# Patient Record
Sex: Male | Born: 1960 | Race: Black or African American | Hispanic: No | Marital: Married | Smoking: Never smoker
Health system: Southern US, Community
[De-identification: ages and names within clinical notes are randomized; demographics above are authoritative.]

## PROBLEM LIST (undated history)

## (undated) DIAGNOSIS — I10 Essential (primary) hypertension: Secondary | ICD-10-CM

---

## 2005-07-14 ENCOUNTER — Emergency Department: Payer: Self-pay | Admitting: Emergency Medicine

## 2009-08-22 ENCOUNTER — Emergency Department: Payer: Self-pay

## 2010-04-05 ENCOUNTER — Ambulatory Visit: Payer: Self-pay | Admitting: Ophthalmology

## 2011-04-13 IMAGING — CR DG CHEST 2V
1 series · 3 of 3 positions shown · non-contrast
Comparison: none

REASON FOR EXAM: [DATE] Uveitis NOS
COMMENTS:

PROCEDURE:     DXR - DXR CHEST PA (OR AP) AND LATERAL  - April 05, 2010 [DATE]
RESULT:     Comparison: None

[Series 1: view not recorded · 0.17mm/px · 3 of 3 slices shown]
[im 1/3]
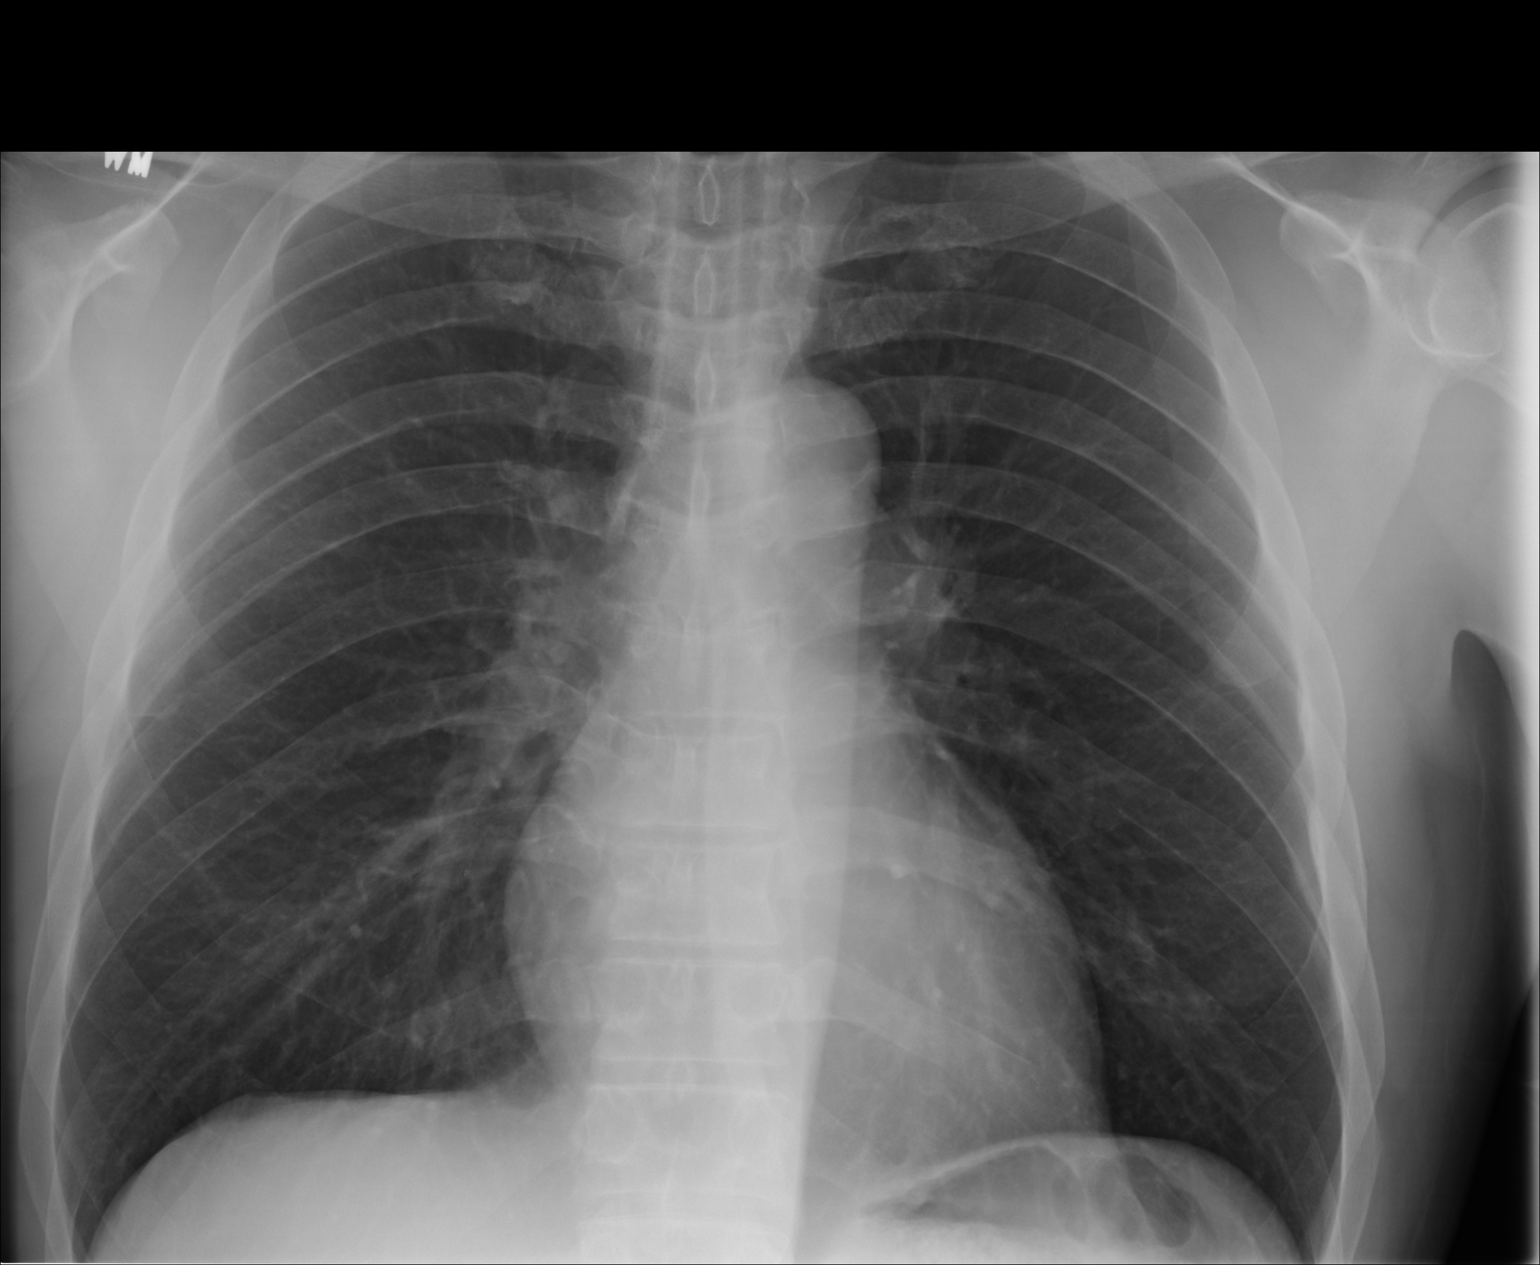
[im 2/3]
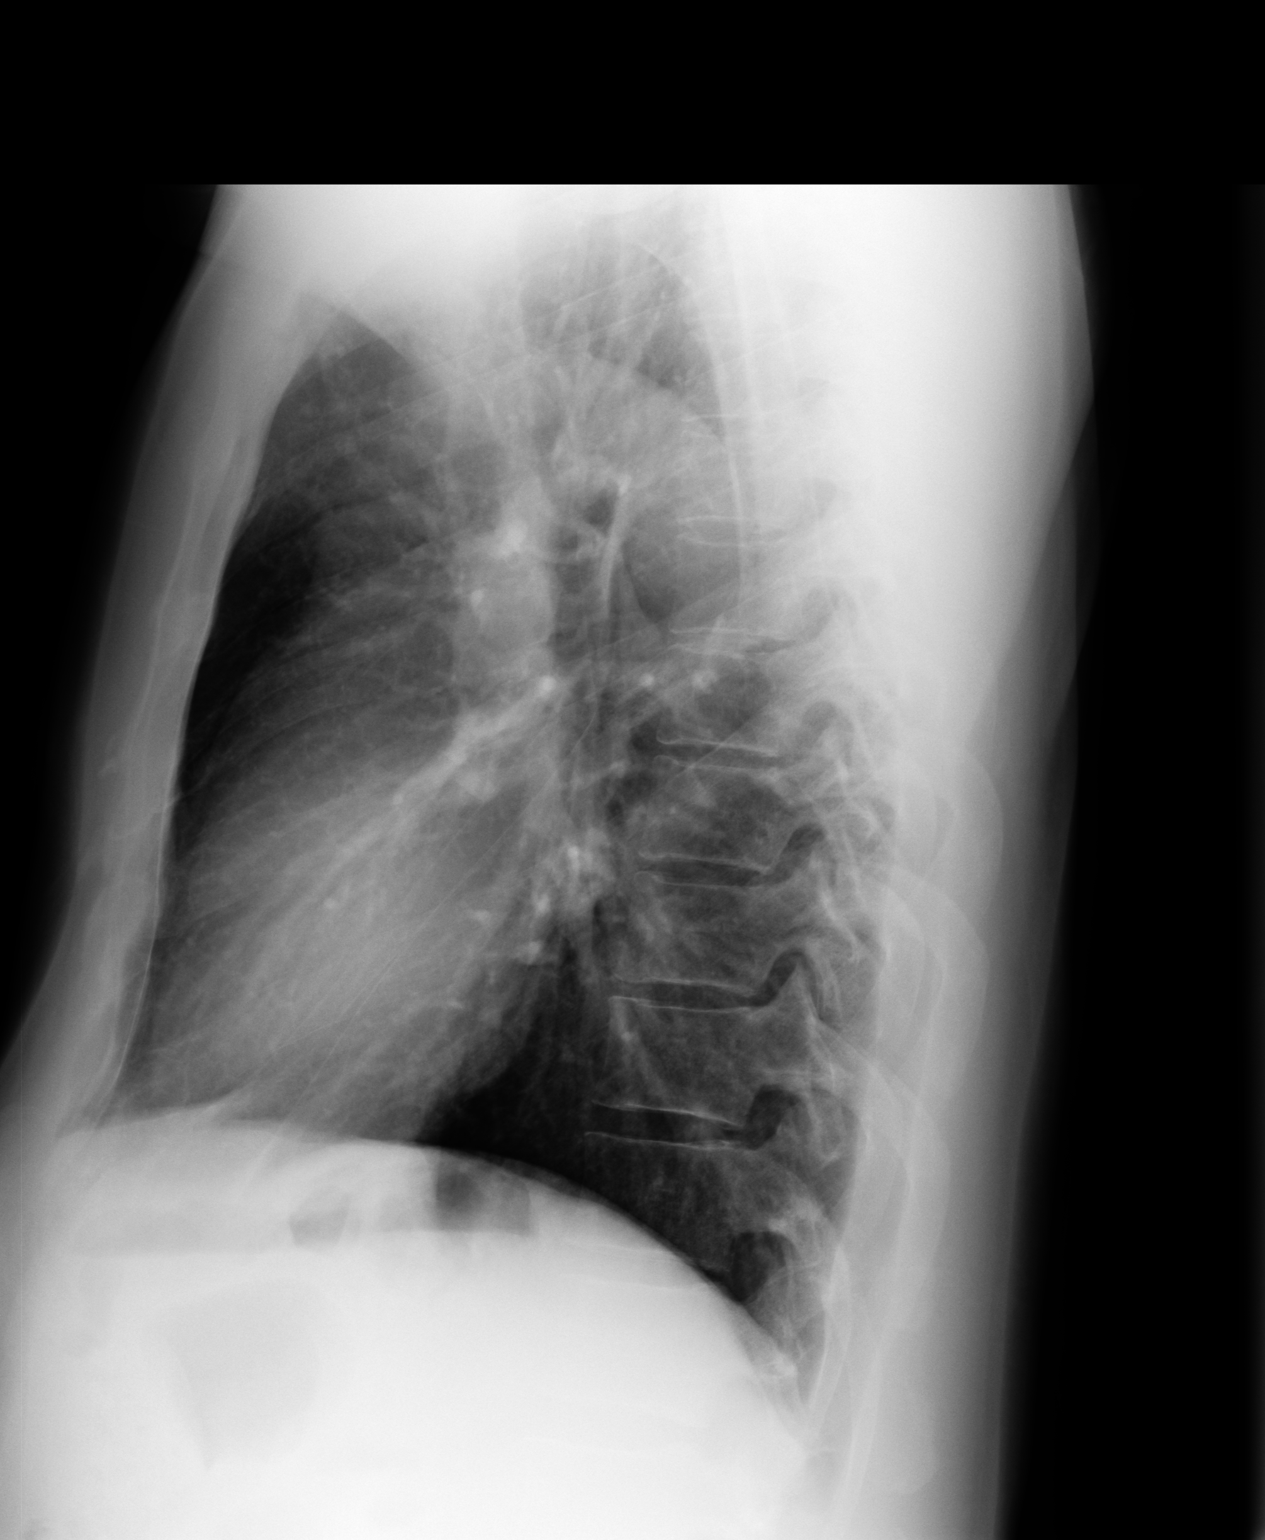
[im 3/3]
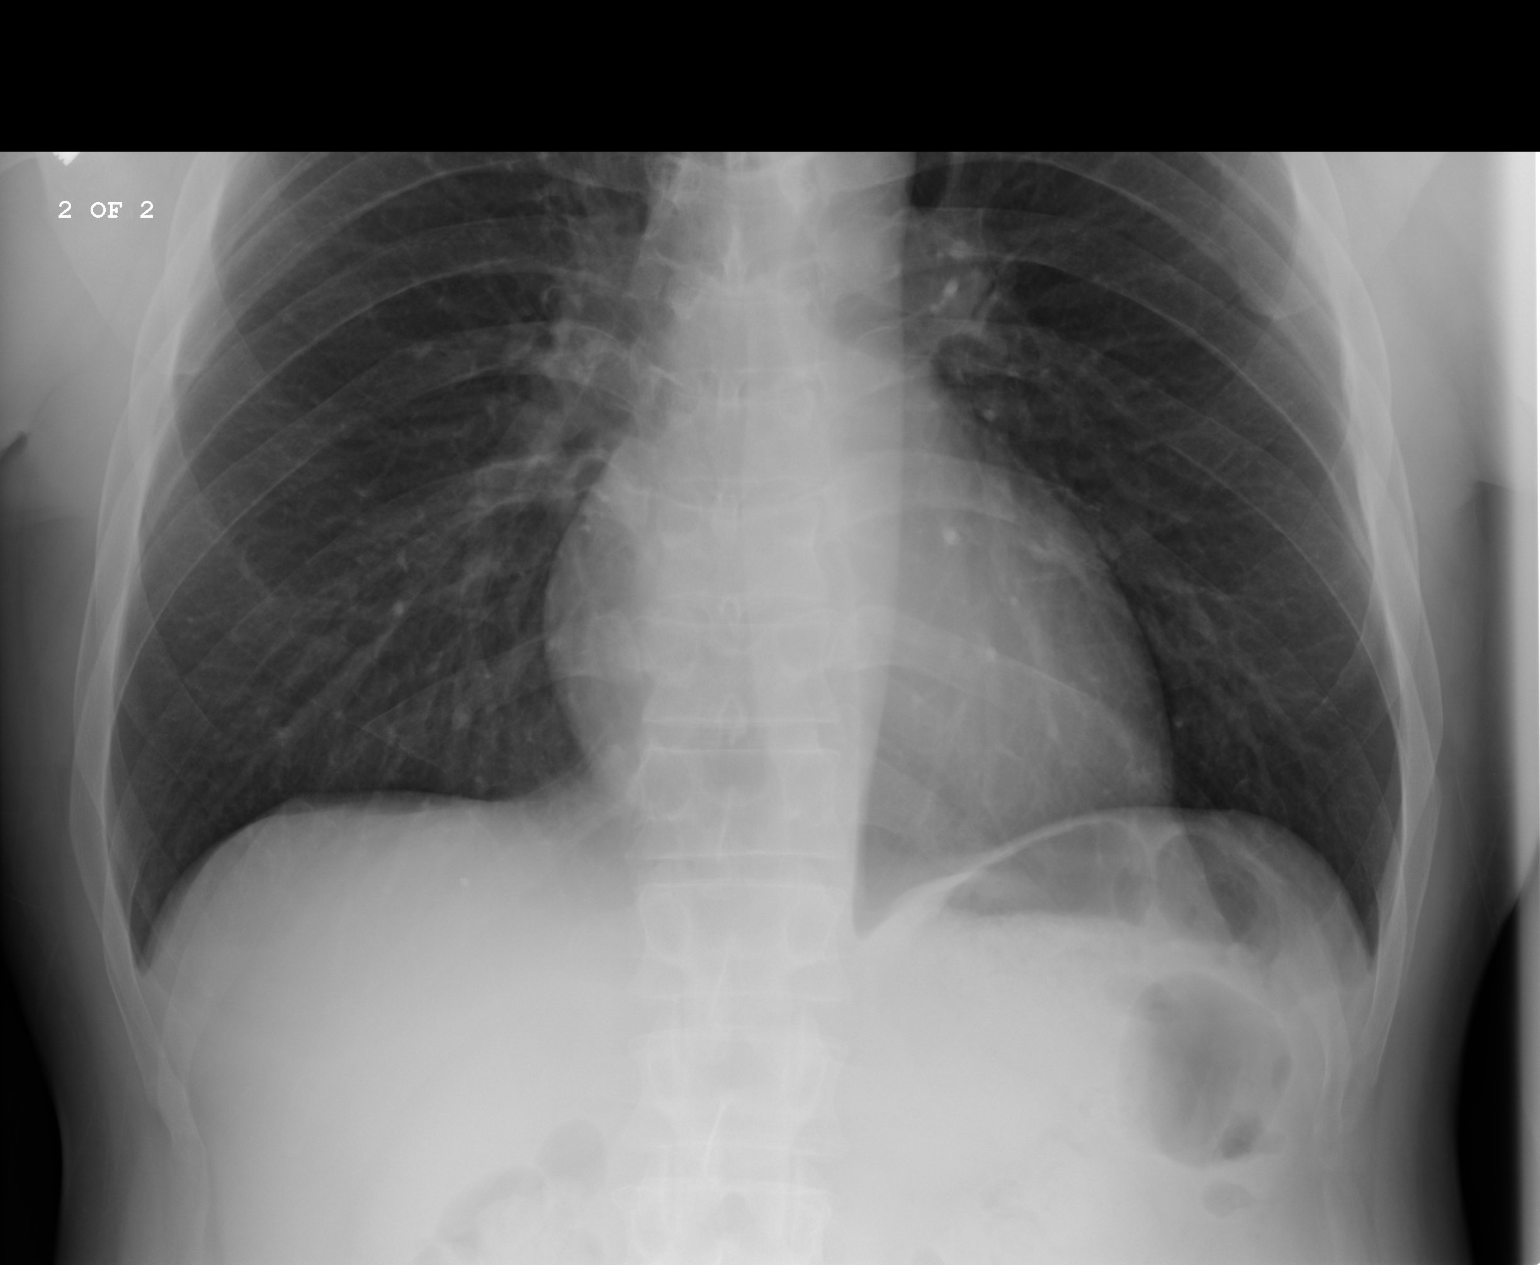

[3 of 3 positions shown; findings below may reference images not displayed]

FINDINGS: PA and lateral chest radiographs are provided.  There is no focal
parenchymal opacity, pleural effusion, or pneumothorax. The heart and
mediastinum are unremarkable. The osseous structures are unremarkable.
IMPRESSION: No acute disease of the chest.

## 2012-01-27 ENCOUNTER — Other Ambulatory Visit: Payer: Self-pay | Admitting: Ophthalmology

## 2012-01-27 LAB — CBC WITH DIFFERENTIAL/PLATELET
Basophil %: 0.3 %
Eosinophil #: 0.1 10*3/uL (ref 0.0–0.7)
HGB: 16.1 g/dL (ref 13.0–18.0)
Lymphocyte #: 1.1 10*3/uL (ref 1.0–3.6)
Lymphocyte %: 37.5 %
MCH: 29.5 pg (ref 26.0–34.0)
MCHC: 34.1 g/dL (ref 32.0–36.0)
MCV: 87 fL (ref 80–100)
Monocyte #: 0.4 x10 3/mm (ref 0.2–1.0)
Neutrophil #: 1.3 10*3/uL — ABNORMAL LOW (ref 1.4–6.5)
Platelet: 203 10*3/uL (ref 150–440)
RBC: 5.45 10*6/uL (ref 4.40–5.90)
WBC: 2.9 10*3/uL — ABNORMAL LOW (ref 3.8–10.6)

## 2016-03-12 ENCOUNTER — Encounter
Admission: RE | Disposition: A | Discharge status: Home / Self Care | Payer: Self-pay | Source: Ambulatory Visit | Attending: Internal Medicine

## 2016-03-12 ENCOUNTER — Encounter: Payer: Self-pay | Admitting: *Deleted

## 2016-03-12 ENCOUNTER — Ambulatory Visit
Admission: RE | Admit: 2016-03-12 | Discharge: 2016-03-12 | Disposition: A | Discharge status: Home / Self Care | Payer: BLUE CROSS/BLUE SHIELD | Source: Ambulatory Visit | Attending: Internal Medicine | Admitting: Internal Medicine

## 2016-03-12 ENCOUNTER — Ambulatory Visit: Admit: 2016-03-12 | Payer: BLUE CROSS/BLUE SHIELD | Admitting: Internal Medicine

## 2016-03-12 DIAGNOSIS — I2510 Atherosclerotic heart disease of native coronary artery without angina pectoris: Secondary | ICD-10-CM | POA: Insufficient documentation

## 2016-03-12 DIAGNOSIS — I10 Essential (primary) hypertension: Secondary | ICD-10-CM | POA: Diagnosis not present

## 2016-03-12 HISTORY — DX: Essential (primary) hypertension: I10

## 2016-03-12 HISTORY — PX: CARDIAC CATHETERIZATION: SHX172

## 2016-03-12 LAB — CARDIAC CATHETERIZATION: Cath EF Quantitative: 45 %

## 2016-03-12 SURGERY — LEFT HEART CATH AND CORONARY ANGIOGRAPHY
Anesthesia: Moderate Sedation | Laterality: Left

## 2016-03-12 SURGERY — LEFT HEART CATH AND CORONARY ANGIOGRAPHY
Anesthesia: Moderate Sedation

## 2016-03-12 MED ORDER — SODIUM CHLORIDE 0.9 % WEIGHT BASED INFUSION: 3.0000 mL/kg/h | INTRAVENOUS | Status: DC

## 2016-03-12 MED ORDER — FENTANYL CITRATE (PF) 100 MCG/2ML IJ SOLN
INTRAMUSCULAR | Status: DC | PRN
  Administered 2016-03-12: 50 ug via INTRAVENOUS

## 2016-03-12 MED ORDER — ACETAMINOPHEN 325 MG PO TABS: 650.0000 mg | ORAL_TABLET | ORAL | Status: DC | PRN

## 2016-03-12 MED ORDER — SODIUM CHLORIDE 0.9% FLUSH: 3.0000 mL | Freq: Two times a day (BID) | INTRAVENOUS | Status: DC

## 2016-03-12 MED ORDER — SODIUM CHLORIDE 0.9 % IV SOLN: 250.0000 mL | INTRAVENOUS | Status: DC | PRN

## 2016-03-12 MED ORDER — SODIUM CHLORIDE 0.9% FLUSH: 3.0000 mL | INTRAVENOUS | Status: DC | PRN

## 2016-03-12 MED ORDER — MIDAZOLAM HCL 2 MG/2ML IJ SOLN
INTRAMUSCULAR | Status: AC
  Filled 2016-03-12: qty 2

## 2016-03-12 MED ORDER — ASPIRIN 81 MG PO CHEW: 81.0000 mg | CHEWABLE_TABLET | Freq: Every day | ORAL | Status: DC

## 2016-03-12 MED ORDER — SODIUM CHLORIDE 0.9 % WEIGHT BASED INFUSION: 1.0000 mL/kg/h | INTRAVENOUS | Status: DC

## 2016-03-12 MED ORDER — MIDAZOLAM HCL 2 MG/2ML IJ SOLN
INTRAMUSCULAR | Status: DC | PRN
  Administered 2016-03-12: 1 mg via INTRAVENOUS

## 2016-03-12 MED ORDER — HEPARIN (PORCINE) IN NACL 2-0.9 UNIT/ML-% IJ SOLN
INTRAMUSCULAR | Status: AC
Start: 2016-03-12 — End: 2016-03-12
  Filled 2016-03-12: qty 500

## 2016-03-12 MED ORDER — IOPAMIDOL (ISOVUE-300) INJECTION 61%
INTRAVENOUS | Status: DC | PRN
  Administered 2016-03-12: 120 mL via INTRA_ARTERIAL

## 2016-03-12 MED ORDER — ONDANSETRON HCL 4 MG/2ML IJ SOLN: 4.0000 mg | Freq: Four times a day (QID) | INTRAMUSCULAR | Status: DC | PRN

## 2016-03-12 MED ORDER — ATORVASTATIN CALCIUM 80 MG PO TABS: 80.0000 mg | ORAL_TABLET | Freq: Every day | ORAL | Status: DC

## 2016-03-12 MED ORDER — CLOPIDOGREL BISULFATE 75 MG PO TABS: 75.0000 mg | ORAL_TABLET | Freq: Every day | ORAL | Status: DC

## 2016-03-12 MED ORDER — ASPIRIN 81 MG PO CHEW: 81.0000 mg | CHEWABLE_TABLET | ORAL | Status: DC

## 2016-03-12 MED ORDER — FENTANYL CITRATE (PF) 100 MCG/2ML IJ SOLN
INTRAMUSCULAR | Status: AC
  Filled 2016-03-12: qty 2

## 2016-03-12 SURGICAL SUPPLY — 9 items
CATH 5FR JL4 DIAGNOSTIC (CATHETERS) ×2 IMPLANT
CATH 5FR PIGTAIL DIAGNOSTIC (CATHETERS) ×3 IMPLANT
CATH INFINITI JR4 5F (CATHETERS) ×3 IMPLANT
DEVICE CLOSURE MYNXGRIP 5F (Vascular Products) ×3 IMPLANT
KIT MANI 3VAL PERCEP (MISCELLANEOUS) ×3 IMPLANT
NEEDLE PERC 18GX7CM (NEEDLE) ×3 IMPLANT
PACK CARDIAC CATH (CUSTOM PROCEDURE TRAY) ×3 IMPLANT
SHEATH AVANTI 5FR X 11CM (SHEATH) ×3 IMPLANT
WIRE EMERALD 3MM-J .035X150CM (WIRE) ×3 IMPLANT

## 2016-03-12 NOTE — Discharge Instructions (Signed)

## 2016-12-09 ENCOUNTER — Ambulatory Visit: Payer: BLUE CROSS/BLUE SHIELD | Admitting: Podiatry

## 2017-01-21 ENCOUNTER — Ambulatory Visit (INDEPENDENT_AMBULATORY_CARE_PROVIDER_SITE_OTHER): Payer: BLUE CROSS/BLUE SHIELD | Admitting: Podiatry

## 2017-01-21 ENCOUNTER — Encounter: Payer: Self-pay | Admitting: Podiatry

## 2017-01-21 ENCOUNTER — Ambulatory Visit (INDEPENDENT_AMBULATORY_CARE_PROVIDER_SITE_OTHER): Payer: BLUE CROSS/BLUE SHIELD

## 2017-01-21 VITALS — BP 147/86 | HR 58 | Resp 16

## 2017-01-21 DIAGNOSIS — M722 Plantar fascial fibromatosis: Secondary | ICD-10-CM | POA: Diagnosis not present

## 2017-01-21 MED ORDER — MELOXICAM 15 MG PO TABS: 15.0000 mg | ORAL_TABLET | Freq: Every day | ORAL | 3 refills | Status: DC

## 2017-01-21 MED ORDER — METHYLPREDNISOLONE 4 MG PO TBPK: ORAL_TABLET | ORAL | 0 refills | Status: AC

## 2017-01-21 NOTE — Patient Instructions (Signed)

## 2017-01-21 NOTE — Progress Notes (Signed)
   Subjective:    Patient ID: Michael Velasquez, male    DOB: 11-Jan-1961, 56 y.o.   MRN: [id number]  HPI: He presents today with chief complaint of right heel pain. He states this been aching for the past few months with a pulling sensation. Mornings are particularly bad and he denies any treatment and denies any trauma.  Review of Systems  All other systems reviewed and are negative.      Objective:   Physical Exam: Vital signs are stable he is alert and oriented 3. Pulses are palpable. Neurologic sensorium is intact. Deep tendon reflexes are intact. Muscle strength +5 over 5 dorsiflexion plantar flexors and inverters everters all intrinsic musculature is intact. Orthopedic evaluation was resolved assistant to the ankle range of motion without crepitation. Rectus foot. Pain on palpation medial calcaneal tubercle of the right heel. Radiographs taken today demonstrate osseous immature individual with a soft tissue increase in density of the plantar fascia calcaneal insertion site indicative of plantar fasciitis. Cutaneous evaluation and a supple well-hydrated cutis no signs of infection.        Assessment & Plan:  Assessment: Plantar fasciitis right.  Plan: Start him on a Medrol Dosepak to be followed by meloxicam. We need to watch the meloxicam carefully because he is on Plavix. I also injected his right heel today with Kenalog and local anesthetic placement plantar fascia brace and a night splint. Discussed appropriate shoe gear stretching exercises ice therapy and sugar modifications area  He works at [employer].

## 2017-02-18 ENCOUNTER — Ambulatory Visit: Payer: BLUE CROSS/BLUE SHIELD | Admitting: Podiatry

## 2018-03-17 ENCOUNTER — Telehealth: Payer: Self-pay | Admitting: Podiatry

## 2018-03-17 ENCOUNTER — Other Ambulatory Visit: Payer: Self-pay | Admitting: *Deleted

## 2018-03-17 MED ORDER — MELOXICAM 15 MG PO TABS: 15.0000 mg | ORAL_TABLET | Freq: Every day | ORAL | 3 refills | Status: AC

## 2018-03-17 NOTE — Telephone Encounter (Signed)
Pt wants a refill on his pain meds. Could you please call patient

## 2018-03-17 NOTE — Telephone Encounter (Signed)
This is Michael Velasquez. Could you give me a call back at this number, [phone]. Thank you.

## 2018-03-17 NOTE — Telephone Encounter (Signed)
Patient hasn't been seen in a year and would need to be evaluated prior to refills of any medications.

## 2018-04-02 ENCOUNTER — Telehealth: Payer: Self-pay | Admitting: Podiatry

## 2018-04-02 NOTE — Telephone Encounter (Signed)
Left message informing pt that if he was continuing to have pain while taking the meloxicam antiinflammatory pain medication then he needed an appt [phone], please call for an appt. I also instructed pt to add ice therapy 3-4 times daily for 15-20 minutes/session protecting the skin with a light cloth.

## 2018-04-02 NOTE — Telephone Encounter (Signed)
Pt called wanting to get his prescription refilled for his Plantar Fasciitis pain. Was last seen in the office in August. Will he need to come back for a follow up before refill? Can you give the patient a call please?

## 2019-06-21 ENCOUNTER — Encounter (INDEPENDENT_AMBULATORY_CARE_PROVIDER_SITE_OTHER): Payer: Self-pay | Admitting: Vascular Surgery

## 2019-08-03 ENCOUNTER — Encounter (INDEPENDENT_AMBULATORY_CARE_PROVIDER_SITE_OTHER): Payer: Self-pay | Admitting: Vascular Surgery

## 2022-03-02 DEATH — deceased
